# Patient Record
Sex: Female | Born: 1990 | Race: White | Hispanic: Yes | Smoking: Never smoker
Health system: Southern US, Community
[De-identification: ages and names within clinical notes are randomized; demographics above are authoritative.]

## PROBLEM LIST (undated history)

## (undated) DIAGNOSIS — R569 Unspecified convulsions: Secondary | ICD-10-CM

## (undated) DIAGNOSIS — D332 Benign neoplasm of brain, unspecified: Secondary | ICD-10-CM

## (undated) HISTORY — DX: Unspecified convulsions: R56.9

## (undated) HISTORY — PX: BRAIN SURGERY: SHX531

## (undated) HISTORY — DX: Benign neoplasm of brain, unspecified: D33.2

---
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
## 2011-03-22 NOTE — Discharge Summary (Signed)
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   SIGNIFICANT FINDINGS:  Dilantin level is 46.1.  Head CT showed stable  1.5 cm x 3 cm left frontal paramedian brain lesion.  Patient was taking  Dilantin Extended Release 400 mg p.o. b.i.d. and it is unclear how she  was converted from her normal Tegretol 400 mg p.o. b.i.d. to this as  there is no comment in the neurology notes that we obtained from [facility]  about the change.  CMP was normal.  Urine pregnancy test was negative.  White blood cell count 5.9, hemoglobin 11.7, platelets 263.  Her  Dilantin level, at discharged, was 34.9.   TREATMENT:  IV fluids and her Dilantin was held and the lab trend was  followed.  Patient was asymptomatic at discharge and at her baseline  without any seizure activity.   OPERATIONS/PROCEDURES:  None.   FINAL DIAGNOSES:  1. Dilantin toxicity resolved, symptoms with continued elevated blood      levels.  2. 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   DISCHARGE MEDICATIONS/INSTRUCTIONS:  Tegretol 400 mg p.o. b.i.d.  Patient is to have a Dilantin level checked on May 16 at [facility]  and a Dilantin and Tegretol level check in May 19 by [facility].  Both results are to be called to the pediatric resident on-call.   PENDING ISSUES AND RESULTS TO BE FOLLOWED:  None.   FOLLOWUP:  Patient is to follow up with the [facility] in  4-6  weeks once she obtains her Medicaid card.  She is also to follow up with  Neurosurgery at [facility] for tumor removal in the next week and a half.   DISCHARGE WEIGHT:  60 kilograms.   DISCHARGE CONDITION:  Improved and stable.          ______________________________  [name], MD    SN/MEDQ  D:  03/22/2007  T:  03/22/2007  Job:  [id number]   cc:   Kennedy Bucker, M.D.

## 2011-03-25 NOTE — Discharge Summary (Signed)
Amanda Hart, BALIK             ACCOUNT NO.:  [id number]   MEDICAL RECORD NO.:  [id number]          PATIENT TYPE:  INP   LOCATION:  6150                         FACILITY:  [facility]   PHYSICIAN:  [name], MD    DATE OF BIRTH:  1991-08-05   DATE OF ADMISSION:  01/08/2007  DATE OF DISCHARGE:  01/09/2007                               DISCHARGE SUMMARY   REASON FOR HOSPITALIZATION:  Was seizures.   HOSPITAL COURSE:  This is a 20 year old female with a history of  generalized tonic clonic seizures, previously occurring 1 time per year.  Diagnosis occurred several years ago.  In the last several weeks, her  seizure frequency has increased to 2 times per week and then yesterday,  she had at least 6 episodes of seizure activity at home.  This activity  was characterized by right eye deviation initially, which progressed to  generalized tonic clonic activity lasting several minutes.  She was  evaluated in the emergency department where a CT showed a low  attenuation collection in the left parasagittal area.  She was febrile  upon presentation and so underwent lumbar puncture to exclude the  possibility of meningitis.  This showed 3 white blood cells, 2 red blood  cells, protein of 28, glucose of 62 with a culture pending.  Given her  CT findings, she had MRI/MRA performed on January 09, 2007, which showed a  cystic lesion high in the left parietal area.  There was no effect or  edema.  In discussion with pediatric neurology, there was concern that  this lesion may represent a glioma versus ganglio glioma versus DNET,  which prompted discussion with neurosurgery at [facility] and led  to the patient's transfer.  Additional labs here included normal  chemistries, normal CBC, normal urinalysis and negative rapid strep.   FINAL DIAGNOSES:  1. Left parietal cystic lesion.  2. Seizure disorder.   DISCHARGE MEDICATIONS:  1. Tegretol 200 mg by mouth b.i.d.  2. Ativan 2 mg IV q.2 hours  p.r.n. seizure activity.   DISPOSITION:  Patient was transferred to [facility] to the  Neurosurgery service, under attending Dr. Hedwig Morton.   PENDING RESULTS:  At the time of discharge, include CSF culture, blood  culture, urine culture and neutrocytosis studies from the CSF.   The entire chart was copied for transfer to [facility] and the  patient was transported via [facility].           ______________________________  [name], MD     SN/MEDQ  D:  01/09/2007  T:  01/10/2007  Job:  [id number]

## 2011-03-25 NOTE — Procedures (Signed)
EEG NUMBER:  [id number]   HISTORY:  This is a 20 year old Hispanic female who has possible seizure  activity, had several episodes once a year for the past 3 years.   PROCEDURE:  This is a routine EEG, technical description.  Throughout  this routine EEG, there is a posterior dominant rhythm of 9-10 Hz  activity at 10-25 microvolts.  Background activity is symmetric, mostly  comprised of alpha range activity at 10-20 microvolts.  There is one  single questionable, isolated right central sharp transient noticed.  This seems to come out of the background, and the significance is  uncertain.  It does not seem to be epileptogenic in character.  With  photic stimulation, there is symmetric photic driving response.  Hyperventilation does not produce any significant abnormalities.  The  patient does become drowsy; however, does not enter stage II sleep.  Throughout this record, there is no definitive epileptiform activity  noticed.   IMPRESSION:  This routine EEG is essentially within normal limits in the  awake state.  There is a questionable isolated right, sharp transient;  however, this seems to come out of the background.  It does not seem  epileptogenic.  I would recommend getting a sleep-deprived EEG to  further delineate, if seizures are of high concern.  Clinical  correlation is advised.      Bevelyn Buckles. Nash Shearer, M.D.  Electronically Signed     [id number]  D:  10/19/2006 14:26:21  T:  10/19/2006 22:02:58  Job #:  [id number]

## 2011-03-25 NOTE — Consult Note (Signed)
NAMESOILA, PRINTUP             ACCOUNT NO.:  [id number]   MEDICAL RECORD NO.:  [id number]          PATIENT TYPE:  INP   LOCATION:  6150                         FACILITY:  [facility]   PHYSICIAN:  Bevelyn Buckles. Champey, M.D.DATE OF BIRTH:  10-13-1991   DATE OF CONSULTATION:  DATE OF DISCHARGE:                                 CONSULTATION   NEUROLOGY CONSULTATION:   REASON FOR CONSULTATION:  Seizures.   REQUESTING PHYSICIAN:  Dr. Rubin Payor   HISTORY OF PRESENT ILLNESS:  Amanda Hart is a 20 year old Hispanic  female with past medical history of seizures (not on any antiepileptic  drugs), who is followed by Dr. Sharene Skeans who presents with increased  frequency of seizures, a total of six to seven today.  The patient has  had seizures for the past three years, initially the seizures were  occurring once a year.  The patient was seen in the office in December  of 2007 at which time had a CT, results are unknown and an EEG, EEG was  essentially normal with a questionable right sharp transient, no repeat  study was performed.  Over the past month, patient was having increased  frequency of seizures approximately two per week.  Since yesterday, the  patient has had six to seven seizures, EMS was called, the patient was  given Valium en route and was given Ativan in the ED and the patient was  noticed to be febrile as well.  Patient has had no ill contacts.  Patient's mother states the seizures usually start with headache and  nausea, patient has right eye deviation, extremities become stiff and  rigid with generalize shaking seizure that usually lasts anywhere from  two to three minutes in duration and afterwards the patient is very  confused and tired.  Patient has no history of head trauma or  infections.  Patient is currently drowsy and sedated.  No other recent  complaints of focal weakness, numbness, dizziness, vertigo, swallowing  problems, chewing problems or falls.   PAST MEDICAL  HISTORY:  Positive for seizures.   CURRENT MEDICATIONS:  None.   ALLERGIES:  No known drug allergies.   FAMILY HISTORY:  Negative for seizures.   SOCIAL HISTORY:  Patient is in the 10th grade; is a good Consulting civil engineer. No  history of developmental delay.   REVIEW OF SYSTEMS:  Unable to assess at this time secondary to  drowsiness, sedation and postictal.   PHYSICAL EXAMINATION:  VITAL SIGNS:  T-max is 100.8; T-current is 99.2.  Blood pressure is 109/41.  Pulse is 90.  Respirations 22.  O2 sat is  98%.  HEENT:  Normocephalic, atraumatic.  Positive doll's eyes.  Pupils are  equal and sluggish, but reactive.  NECK:  Supple, no carotid bruits.  HEART:  Regular.  LUNGS:  Clear.  ABDOMEN:  Soft, nontender.  EXTREMITIES:  Show good pulses.  NEUROLOGICAL EXAMINATION:  Patient is drowsy and lethargic/sedated.  She  is arousable and answers a few questions appropriately such as her name,  place, and age.  Cranial nerves:  Patient has positive doll's eyes.  Pupils are equal, round and reactive to  light.  Face is symmetric.  Motor examination:  Patient moving all four extremities equally upon  stimulation, has decreased tone in all four extremities.  Sensory  examination:  Patient withdraws in all four extremities to painful  stimuli.  Reflexes are 1 to 2+ throughout.  Toes are neutral  bilaterally.  Cerebellar function and gait are not able to be assessed  at this time secondary to patient's mental status.   CT of the head showed a left parasagittal hypodensity, questionable  dermoid versus epidermoid.  Recommend and MRI scan for further  evaluation.   LABS:  WBC is 8.4, hemoglobin 11.9, hematocrit is 35.0, platelets 240,  sodium is 141, potassium is 3.5, chloride is 111, CO2 is 20, BUN 4,  creatinine 0.8, glucose 88.  CSF shows 3 WBCs, 2 RBCs, protein 28,  glucose 62.   IMPRESSION:  This is a 20 year old Hispanic female with recurrent  seizures, fever.  Description of patient's seizures  seem possibly  partial onset with secondary generalization, especially with the  patient's right eye deviation, with the patient's left parasagittal  hypodensity on the CT scan, which could be her focus.  Patient was seen  in the emergency room and given Ativan.  Also start Tegretol 200 mg  twice a day when patient is taking p.o. medications.  She is currently  sedated and drowsy.  We will check an MRI to further evaluate  hypodensity in left parasagittal area, and we will check an EEG in the  morning.  Continue the Ativan p.r.n. if the patient has recurrent  seizures.  LP results are noted.  HSV-PCR is pending.  We will  empirically start acyclovir until PCR results return.  I will discuss  the case and follow up with Dr. Sharene Skeans.  I have discussed the case  with primary pediatric service.  Please call with any further questions  or concerns.   We will follow the patient.      Bevelyn Buckles. Nash Shearer, M.D.  Electronically Signed     DRC/MEDQ  D:  01/08/2007  T:  01/09/2007  Job:  [id number]

## 2011-03-25 NOTE — Procedures (Signed)
EEG NUMBER:  [id number].   CLINICAL HISTORY:  The patient is a 20 year old female with a history of  partial seizures, head and eyes deviated to the right, with  unresponsiveness and secondary generalization.  Study is being done to  look for the presence of seizure disorder.  345.10.   PROCEDURE:  The tracing is carried out on a 32-channel digital Cadwell  recorder reformatted into 16 channel montages, with 1 devoted to EKG.  The patient was awake and asleep during the recording.  The  International 10/20 system lead placement was used.   MEDICATIONS:  Tegretol, ceftriaxone, dextrose acyclovir, acetaminophen,  and lorazepam.   DESCRIPTION OF FINDINGS:  Dominant frequency is a 9 Hz 30 microvolt  activity that is most common the posterior temporal regions but is seen  in a lower voltage similar frequency occipitally.   Essentially predominant mixed frequency theta range activity of 30  microvolts was seen.   The patient has bursts of generalized rhythmic 70 microvolt delta range  activity that is a manifestation of drowsiness.   There rare sharply contoured slow waves in the temporal regions.  These  are not consistent.   The patient drifts into natural sleep with vertex sharp waves and second  symmetric and synchronous sleep spindles and low voltage delta range  activity.  Toward the end of the record, generalized delta range  activity was seen in bursts as before.  Photic stimulation failed to  induce a driving response.  Hyperventilation was not carried out.   EKG showed a regular sinus rhythm with ventricular response of 102 beats  per minute.   IMPRESSION:  Abnormal EEG on the basis of mild diffuse background  slowing.  This is a nonspecific indicator of neuronal dysfunction that  in all likelihood relates to a postictal state.  There is no focality in  the record in no evidence of seizures.      Deanna Artis. Sharene Skeans, M.D.  Electronically Signed     ZOX:WRUE  D:   01/09/2007 17:17:42  T:  01/10/2007 07:51:50  Job #:  [id number]   cc:   [name], MD   [facility] [address]  [address]  [facility]  [place] [address]

## 2011-08-25 LAB — CBC
HCT: 36.1
Hemoglobin: 12.1
RBC: 4.09
RDW: 12.8

## 2011-08-25 LAB — DIFFERENTIAL
Eosinophils Relative: 1
Lymphocytes Relative: 22 — ABNORMAL LOW
Monocytes Absolute: 0.4
Monocytes Relative: 7
Neutro Abs: 4.1

## 2017-05-11 DIAGNOSIS — R569 Unspecified convulsions: Secondary | ICD-10-CM | POA: Diagnosis not present

## 2017-05-30 DIAGNOSIS — R569 Unspecified convulsions: Secondary | ICD-10-CM | POA: Diagnosis not present

## 2017-05-30 DIAGNOSIS — R8279 Other abnormal findings on microbiological examination of urine: Secondary | ICD-10-CM | POA: Diagnosis not present

## 2017-07-28 DIAGNOSIS — Z85841 Personal history of malignant neoplasm of brain: Secondary | ICD-10-CM | POA: Diagnosis not present

## 2017-07-28 DIAGNOSIS — R569 Unspecified convulsions: Secondary | ICD-10-CM | POA: Diagnosis not present

## 2017-08-05 DIAGNOSIS — E860 Dehydration: Secondary | ICD-10-CM | POA: Diagnosis not present

## 2017-10-20 DIAGNOSIS — N63 Unspecified lump in unspecified breast: Secondary | ICD-10-CM | POA: Diagnosis not present

## 2017-10-20 DIAGNOSIS — R569 Unspecified convulsions: Secondary | ICD-10-CM | POA: Diagnosis not present

## 2017-12-07 DIAGNOSIS — Z01419 Encounter for gynecological examination (general) (routine) without abnormal findings: Secondary | ICD-10-CM | POA: Diagnosis not present

## 2017-12-07 DIAGNOSIS — R590 Localized enlarged lymph nodes: Secondary | ICD-10-CM | POA: Diagnosis not present

## 2017-12-19 ENCOUNTER — Ambulatory Visit: Payer: Commercial Managed Care - PPO | Admitting: Neurology

## 2017-12-19 ENCOUNTER — Encounter (INDEPENDENT_AMBULATORY_CARE_PROVIDER_SITE_OTHER): Payer: Self-pay

## 2017-12-19 ENCOUNTER — Encounter: Payer: Self-pay | Admitting: Neurology

## 2017-12-19 VITALS — BP 128/80 | HR 71 | Ht 62.0 in | Wt 140.0 lb

## 2017-12-19 DIAGNOSIS — D330 Benign neoplasm of brain, supratentorial: Secondary | ICD-10-CM | POA: Diagnosis not present

## 2017-12-19 DIAGNOSIS — G40209 Localization-related (focal) (partial) symptomatic epilepsy and epileptic syndromes with complex partial seizures, not intractable, without status epilepticus: Secondary | ICD-10-CM | POA: Diagnosis not present

## 2017-12-19 MED ORDER — LAMOTRIGINE 25 MG PO TABS: 25.0000 mg | ORAL_TABLET | Freq: Every day | ORAL | 0 refills | Status: DC

## 2017-12-19 MED ORDER — LAMOTRIGINE 100 MG PO TABS: 100.0000 mg | ORAL_TABLET | Freq: Every day | ORAL | 11 refills | Status: DC

## 2017-12-19 NOTE — Addendum Note (Signed)
Addended by: Inis Sizer D on: 12/19/2017 08:49 AM   Modules accepted: Orders

## 2017-12-19 NOTE — Progress Notes (Signed)
PATIENT: Amanda Hart DOB: 1991-09-01  Chief Complaint  Patient presents with  . Seizures    She was diagnosed with seizures in her teens. She was discovered to have a benign brain tumor that was removed.  After her surgery, she took medication for one year and then it was discontinued.  She did not have another seizure for around ten years.  She is currenlty taking carbamazepine 200mg , two tablets BID.  Her last seizure was 2+ years ago.  She is here to establish new neurology care.  Marland Kitchen PCP    Lujean Amel, MD     HISTORICAL  Amanda Hart is a 27 year old female, seen in refer by primary care physician Dr. Dorthy Cooler, [place] for evaluation of seizure, initial evaluation was on December 19, 2017.  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  Laboratory evaluations normal CMP, creatinine of 0.63, CBC hemoglobin of 12.9 Tegretol level was  1.9  REVIEW OF SYSTEMS: Full 14 system review of systems performed and notable only for as above  ALLERGIES: No Known Allergies  HOME MEDICATIONS: Current Outpatient Medications  Medication Sig Dispense Refill  . carbamazepine (TEGRETOL) 200 MG tablet Take 400 mg by mouth 2 (two) times daily.  4   No current facility-administered medications for this visit.     PAST MEDICAL HISTORY: Past Medical History:  Diagnosis Date  . Benign brain tumor ([place])   . Seizure ([place])     PAST SURGICAL HISTORY: Past Surgical History:  Procedure Laterality Date  . BRAIN SURGERY     removal of benign tumor    FAMILY HISTORY: Family History  Problem Relation Age of Onset  . Healthy Mother   . Healthy Father     SOCIAL HISTORY:  Social History   Socioeconomic History  . Marital status: Married    Spouse name: Not on file  . Number of children: 0  . Years of education: 49  . Highest education level: Associate degree: occupational, Hotel manager, or vocational program  Social Needs  . Financial resource strain: Not on file  . Food insecurity - worry: Not on file  . Food insecurity - inability: Not on file  . Transportation needs - medical: Not on file  . Transportation needs - non-medical: Not on file  Occupational History  . Occupation: Retail  Tobacco Use  . Smoking status: Never Smoker  . Smokeless tobacco: Never Used  Substance and Sexual Activity  . Alcohol use: No  Frequency: Never  . Drug use: No  . Sexual activity: Not on file  Other Topics Concern  . Not on file  Social History Narrative   Lives at home with her husband.   Right-handed.   Occasional caffeine use.     PHYSICAL EXAM   Vitals:   12/19/17 0752  BP: 128/80  Pulse: 71  Weight: 140 lb (63.5 kg)  Height: 5\' 2"  (1.575 m)    Not recorded      Body mass index is 25.61 kg/m.  PHYSICAL EXAMNIATION:  Gen: NAD, conversant, well nourised, obese, well groomed                     Cardiovascular:  Regular rate rhythm, no peripheral edema, warm, nontender. Eyes: Conjunctivae clear without exudates or hemorrhage Neck: Supple, no carotid bruits. Pulmonary: Clear to auscultation bilaterally   NEUROLOGICAL EXAM:  MENTAL STATUS: Speech:    Speech is normal; fluent and spontaneous with normal comprehension.  Cognition:     Orientation to time, place and person     Normal recent and remote memory     Normal Attention span and concentration     Normal Language, naming, repeating,spontaneous speech     Fund of knowledge   CRANIAL NERVES: CN II: Visual fields are full to confrontation. Fundoscopic exam is normal with sharp discs and no vascular changes. Pupils are round equal and briskly reactive to light. CN III, IV, VI: extraocular movement are normal. No ptosis. CN V: Facial sensation is intact to pinprick in all 3 divisions bilaterally. Corneal responses are intact.  CN VII: Face is symmetric with normal eye closure and smile. CN VIII: Hearing is normal to rubbing fingers CN IX, X: Palate elevates symmetrically. Phonation is normal. CN XI: Head turning and shoulder shrug are intact CN XII: Tongue is midline with normal movements and no atrophy.  MOTOR: There is no pronator drift of out-stretched arms. Muscle bulk and tone are normal. Muscle strength is normal.  REFLEXES: Reflexes are 2+ and symmetric at the biceps, triceps, knees, and ankles. Plantar responses are flexor.  SENSORY: Intact to light touch, pinprick, positional sensation and vibratory sensation are intact in fingers and toes.  COORDINATION: Rapid alternating movements and fine finger movements are intact. There is no dysmetria on finger-to-nose and heel-knee-shin.    GAIT/STANCE: Posture is normal. Gait is steady with normal steps, base, arm swing, and turning. Heel and toe walking are normal. Tandem gait is normal.  Romberg is absent.   DIAGNOSTIC DATA (LABS, IMAGING, TESTING) - I reviewed patient records,  labs, notes, testing and imaging myself where available.   ASSESSMENT AND PLAN  Amanda Hart is a 27 y.o. 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 Complex partial seizure  She still has multiple recurrent spells while taking Tegretol 400 mg twice a day, the level was within therapeutic level,  Discussed with patient, will switch her to lamotrigine 100 mg / 200 mg, tapering off Tegretol, schedule was written out for her  Repeat MRI of the brain with and without contrast  EEG   Marcial Pacas, M.D. Ph.D.  [facility] [address] [place], [place] [address] Ph: [phone] Fax: [phone]  YP:[id number], [place], MD

## 2017-12-20 ENCOUNTER — Telehealth: Payer: Self-pay | Admitting: Neurology

## 2017-12-20 LAB — COMPREHENSIVE METABOLIC PANEL
A/G RATIO: 2 (ref 1.2–2.2)
ALT: 9 IU/L (ref 0–32)
AST: 15 IU/L (ref 0–40)
Albumin: 4.5 g/dL (ref 3.5–5.5)
Alkaline Phosphatase: 92 IU/L (ref 39–117)
BUN/Creatinine Ratio: 14 (ref 9–23)
BUN: 9 mg/dL (ref 6–20)
Bilirubin Total: 0.2 mg/dL (ref 0.0–1.2)
CALCIUM: 8.9 mg/dL (ref 8.7–10.2)
CO2: 22 mmol/L (ref 20–29)
CREATININE: 0.64 mg/dL (ref 0.57–1.00)
Chloride: 103 mmol/L (ref 96–106)
GFR, EST AFRICAN AMERICAN: 141 mL/min/{1.73_m2} (ref >59)
GFR, EST NON AFRICAN AMERICAN: 123 mL/min/{1.73_m2} (ref >59)
GLOBULIN, TOTAL: 2.3 g/dL (ref 1.5–4.5)
Glucose: 92 mg/dL (ref 65–99)
POTASSIUM: 4.2 mmol/L (ref 3.5–5.2)
Sodium: 140 mmol/L (ref 134–144)
TOTAL PROTEIN: 6.8 g/dL (ref 6.0–8.5)

## 2017-12-20 LAB — CBC WITH DIFFERENTIAL
BASOS: 1 %
Basophils Absolute: 0 10*3/uL (ref 0.0–0.2)
EOS (ABSOLUTE): 0 10*3/uL (ref 0.0–0.4)
EOS: 1 %
HEMATOCRIT: 36.6 % (ref 34.0–46.6)
Hemoglobin: 12.7 g/dL (ref 11.1–15.9)
IMMATURE GRANS (ABS): 0 10*3/uL (ref 0.0–0.1)
IMMATURE GRANULOCYTES: 0 %
LYMPHS: 32 %
Lymphocytes Absolute: 1.1 10*3/uL (ref 0.7–3.1)
MCH: 30.7 pg (ref 26.6–33.0)
MCHC: 34.7 g/dL (ref 31.5–35.7)
MCV: 88 fL (ref 79–97)
Monocytes Absolute: 0.3 10*3/uL (ref 0.1–0.9)
Monocytes: 8 %
NEUTROS PCT: 58 %
Neutrophils Absolute: 2.1 10*3/uL (ref 1.4–7.0)
RBC: 4.14 x10E6/uL (ref 3.77–5.28)
RDW: 13.2 % (ref 12.3–15.4)
WBC: 3.5 10*3/uL (ref 3.4–10.8)

## 2017-12-20 LAB — TSH: TSH: 1.09 u[IU]/mL (ref 0.450–4.500)

## 2017-12-20 NOTE — Telephone Encounter (Signed)
-----   Message from Marcial Pacas, MD sent at 12/20/2017 11:16 AM EST ----- Please call patient for normal laboratory result

## 2017-12-20 NOTE — Telephone Encounter (Signed)
Pt returned call and I was able to inform her of her lab work all being WNL. Pt verbalized understanding. Pt had no questions at this time but was encouraged to call back if questions arise.

## 2017-12-20 NOTE — Telephone Encounter (Signed)
Called the patient to discuss the lab work.no answer. LVM for the patient to call back

## 2017-12-21 ENCOUNTER — Other Ambulatory Visit: Payer: Commercial Managed Care - PPO

## 2018-01-01 ENCOUNTER — Other Ambulatory Visit: Payer: Commercial Managed Care - PPO

## 2018-01-02 ENCOUNTER — Encounter: Payer: Self-pay | Admitting: Neurology

## 2018-03-20 ENCOUNTER — Ambulatory Visit: Payer: Commercial Managed Care - PPO | Admitting: Neurology

## 2018-05-09 DIAGNOSIS — H10021 Other mucopurulent conjunctivitis, right eye: Secondary | ICD-10-CM | POA: Diagnosis not present

## 2018-05-23 ENCOUNTER — Telehealth: Payer: Self-pay | Admitting: Neurology

## 2018-05-23 MED ORDER — CARBAMAZEPINE 200 MG PO TABS: 400.0000 mg | ORAL_TABLET | Freq: Two times a day (BID) | ORAL | 1 refills | Status: DC

## 2018-05-23 NOTE — Telephone Encounter (Signed)
Dr. Rhea Belton notes from 12/19/17:  Complex partial seizure             She still has multiple recurrent spells while taking Tegretol 400 mg twice a day, the level was within therapeutic level,             Discussed with patient, will switch her to lamotrigine 100 mg / 200 mg, tapering off Tegretol, schedule was written out for her             Repeat MRI of the brain with and without contrast             EEG

## 2018-05-23 NOTE — Telephone Encounter (Signed)
Pt called stating she has decided to to stay on carbamazepine (TEGRETOL) 200 MG tablet instead of tapering off. Pt is now down to only a few tablets left, requesting a refill sent to [facility]

## 2018-05-23 NOTE — Telephone Encounter (Addendum)
Returned call to patient today.  States she never started the lamotrigine and decided to continue carbamazepine 400mg , BID.  She had planned on discussing this with Dr. Krista Blue at her next visit on 07/03/18.  She will run out of carbamazepine 200mg , two tablets BID prior to her appt.  Per vo by Dr. Rexene Alberts (work-in MD), provide patient with a refill of carbamazepine, at current dose, to last until at her next appt with Dr. Krista Blue.

## 2018-07-03 ENCOUNTER — Telehealth: Payer: Self-pay | Admitting: Neurology

## 2018-07-03 ENCOUNTER — Encounter: Payer: Self-pay | Admitting: Neurology

## 2018-07-03 ENCOUNTER — Ambulatory Visit: Payer: Commercial Managed Care - PPO | Admitting: Neurology

## 2018-07-03 VITALS — BP 125/83 | HR 82 | Ht 62.0 in | Wt 141.0 lb

## 2018-07-03 DIAGNOSIS — G40209 Localization-related (focal) (partial) symptomatic epilepsy and epileptic syndromes with complex partial seizures, not intractable, without status epilepticus: Secondary | ICD-10-CM

## 2018-07-03 MED ORDER — CARBAMAZEPINE 200 MG PO TABS: 400.0000 mg | ORAL_TABLET | Freq: Two times a day (BID) | ORAL | 4 refills | Status: DC

## 2018-07-03 NOTE — Progress Notes (Signed)
PATIENT: Amanda Hart DOB: 20-Nov-1990  Chief Complaint  Patient presents with  . Seizures    She never started Lamictal because she felt uncomfortable switching medications.  She has continued taking Tegretol 400mg  BID.  She missed her EEG appt and never rescheduled.  She never made the appt for her MRI.      HISTORICAL  Amanda Hart is a 27 year old female, seen in refer by primary care physician Dr. Dorthy Cooler, [place] for evaluation of seizure, initial evaluation was on December 19, 2017.  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  REVIEW OF SYSTEMS: Full 14 system review of systems performed and notable only for as above  ALLERGIES: No Known Allergies  HOME MEDICATIONS: Current Outpatient Medications  Medication Sig Dispense Refill  . carbamazepine (TEGRETOL) 200 MG tablet Take 2 tablets (400 mg total) by mouth 2 (two) times daily. 120 tablet 1   No current facility-administered medications for this visit.     PAST MEDICAL HISTORY: Past Medical History:  Diagnosis Date  . Benign brain tumor ([place])   . Seizure ([place])     PAST SURGICAL HISTORY: Past Surgical History:  Procedure Laterality Date  . BRAIN SURGERY     removal of benign tumor    FAMILY HISTORY: Family History  Problem Relation Age of Onset  . Healthy Mother   . Healthy Father     SOCIAL HISTORY:  Social History   Socioeconomic History  . Marital status: Married    Spouse name: Not on file  . Number of children: 0  . Years of education: 59  . Highest education level: Associate degree: occupational, Hotel manager, or vocational program  Occupational History  . Occupation: Lexicographer  . Financial resource strain: Not on file  . Food insecurity:    Worry: Not on file    Inability: Not on file  . Transportation  needs:    Medical: Not on file    Non-medical: Not on file  Tobacco Use  . Smoking status: Never Smoker  . Smokeless tobacco: Never Used  Substance and Sexual Activity  . Alcohol use: No    Frequency: Never  . Drug use: No  . Sexual activity: Not on file  Lifestyle  . Physical activity:    Days per week: Not on file    Minutes per session: Not on file  . Stress: Not on file  Relationships  . Social connections:    Talks on phone: Not on file     Gets together: Not on file    Attends religious service: Not on file    Active member of club or organization: Not on file    Attends meetings of clubs or organizations: Not on file    Relationship status: Not on file  . Intimate partner violence:    Fear of current or ex partner: Not on file    Emotionally abused: Not on file    Physically abused: Not on file    Forced sexual activity: Not on file  Other Topics Concern  . Not on file  Social History Narrative   Lives at home with her husband.   Right-handed.   Occasional caffeine use.     PHYSICAL EXAM   Vitals:   07/03/18 0716  BP: 125/83  Pulse: 82  Weight: 141 lb (64 kg)  Height: 5\' 2"  (1.575 m)    Not recorded      Body mass index is 25.79 kg/m.  PHYSICAL EXAMNIATION:  Gen: NAD, conversant, well nourised, obese, well groomed                     Cardiovascular: Regular rate rhythm, no peripheral edema, warm, nontender. Eyes: Conjunctivae clear without exudates or hemorrhage Neck: Supple, no carotid bruits. Pulmonary: Clear to auscultation bilaterally   NEUROLOGICAL EXAM:  MENTAL STATUS: Speech:    Speech is normal; fluent and spontaneous with normal comprehension.  Cognition:     Orientation to time, place and person     Normal recent and remote memory     Normal Attention span and concentration     Normal Language, naming, repeating,spontaneous speech     Fund of knowledge   CRANIAL NERVES: CN II: Visual fields are full to confrontation. Fundoscopic exam is normal with sharp discs and no vascular changes. Pupils are round equal and briskly reactive to light. CN III, IV, VI: extraocular movement are normal. No ptosis. CN V: Facial sensation is intact to pinprick in all 3 divisions bilaterally. Corneal responses are intact.  CN VII: Face is symmetric with normal eye closure and smile. CN VIII: Hearing is normal to rubbing fingers CN IX, X: Palate elevates symmetrically. Phonation is normal. CN XI:  Head turning and shoulder shrug are intact CN XII: Tongue is midline with normal movements and no atrophy.  MOTOR: There is no pronator drift of out-stretched arms. Muscle bulk and tone are normal. Muscle strength is normal.  REFLEXES: Reflexes are 2+ and symmetric at the biceps, triceps, knees, and ankles. Plantar responses are flexor.  SENSORY: Intact to light touch, pinprick, positional sensation and vibratory sensation are intact in fingers and toes.  COORDINATION: Rapid alternating movements and fine finger movements are intact. There is no dysmetria on finger-to-nose and heel-knee-shin.    GAIT/STANCE: Posture is normal. Gait is steady with normal steps, base, arm swing, and turning. Heel and toe walking  are normal. Tandem gait is normal.  Romberg is absent.   DIAGNOSTIC DATA (LABS, IMAGING, TESTING) - I reviewed patient records, labs, notes, testing and imaging myself where available.   ASSESSMENT AND PLAN  Amanda Hart is a 27 y.o. 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 Complex partial seizure  She still has multiple recurrent spells while taking Tegretol 400 mg twice a day, the level was within therapeutic level,  I have suggeste no change to lamotrigine, but she is hesitant to make any change at this point,  MRI of the brain with and without contrast  EEG  No driving for 6 months if she has any episode of sudden loss of consciousness   Marcial Pacas, M.D. Ph.D.  [facility] [address] [place], [place] [address] Ph: [phone] Fax: [phone]  PJ:[email or web address], [place], MD

## 2018-07-03 NOTE — Telephone Encounter (Signed)
left message w/pt mom to give me a call back   [place]: [phone] (exp. 07/03/18 to 08/01/18)

## 2018-07-30 NOTE — Telephone Encounter (Signed)
Spoke to the patient I informed her since her deducible hasn't been met it would be about $1,592.91  I did off the payment plan. She stated she wanted to hold off right now.

## 2018-07-30 NOTE — Telephone Encounter (Signed)
Pt has called asking to be scheduled for her MRI 10-29. Please call back

## 2018-09-04 ENCOUNTER — Other Ambulatory Visit: Payer: Commercial Managed Care - PPO

## 2018-09-05 ENCOUNTER — Encounter: Payer: Self-pay | Admitting: Neurology

## 2018-10-29 DIAGNOSIS — Z0000 Encounter for general adult medical examination without abnormal findings: Secondary | ICD-10-CM | POA: Diagnosis not present

## 2018-10-29 DIAGNOSIS — Z13220 Encounter for screening for lipoid disorders: Secondary | ICD-10-CM | POA: Diagnosis not present

## 2019-07-08 ENCOUNTER — Encounter: Payer: Self-pay | Admitting: Adult Health

## 2019-07-08 ENCOUNTER — Ambulatory Visit: Payer: Commercial Managed Care - PPO | Admitting: Adult Health

## 2019-07-12 ENCOUNTER — Other Ambulatory Visit: Payer: Self-pay | Admitting: Neurology

## 2019-09-08 ENCOUNTER — Other Ambulatory Visit: Payer: Self-pay | Admitting: Neurology

## 2019-09-11 NOTE — Progress Notes (Signed)
PATIENT: Amanda Hart DOB: [birth year]  REASON FOR VISIT: follow up HISTORY FROM: patient  HISTORY OF PRESENT ILLNESS: Today 09/12/19  HISTORY Amanda Hart is a 28 year old female, seen in refer by primary care physician Dr. Dorthy Cooler, [place] for evaluation of seizure, initial evaluation was on December 19, 2017.  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  Update September 12, 2019 SS: She has not had recurrent seizure since last seen, last seizure was in 2015.  She remains on Tegretol 400 mg twice a day.  She is tolerating medication without side effect.  She has not been interested in switching to Lamictal in the past.  She denies any further dj vu episodes, since changing her diet to limiting sugar and carbs.  She is not driving a car right now.  She does not work, says she is a stay-at-home wife.  She indicates her overall health is well.  She denies any new problems or concerns. She was unable to get EEG or MRI of the brain due to high cost.   REVIEW OF SYSTEMS: Out of a complete 14 system review of symptoms, the patient complains only of the following symptoms, and all other reviewed systems are negative.  Seizures   ALLERGIES: No Known Allergies  HOME MEDICATIONS: Outpatient Medications Prior to Visit  Medication Sig Dispense Refill  . carbamazepine (TEGRETOL) 200 MG tablet TAKE 2 TABLETS BY MOUTH 2 (TWO) TIMES DAILY. PLEASE CALL [phone] TO SCHEDULE YEARLY APPT, 120 tablet 0   No facility-administered medications prior to visit.     PAST MEDICAL HISTORY: Past Medical History:  Diagnosis Date  . Benign brain tumor ([place])   . Seizure ([place])     PAST SURGICAL HISTORY: Past Surgical History:  Procedure Laterality Date  . BRAIN SURGERY     removal of benign tumor    FAMILY HISTORY: Family History  Problem Relation  Age of Onset  . Healthy Mother   . Healthy Father   . Healthy Brother     SOCIAL HISTORY: Social History   Socioeconomic History  . Marital status: Married    Spouse name: Not on file  . Number of children: 0  . Years of education: 58  . Highest education level: Associate degree: occupational, Hotel manager, or vocational program  Occupational History  . Occupation: Lexicographer  . Financial resource strain:  Not on file  . Food insecurity    Worry: Not on file    Inability: Not on file  . Transportation needs    Medical: Not on file    Non-medical: Not on file  Tobacco Use  . Smoking status: Never Smoker  . Smokeless tobacco: Never Used  Substance and Sexual Activity  . Alcohol use: No    Frequency: Never  . Drug use: No  . Sexual activity: Not on file  Lifestyle  . Physical activity    Days per week: Not on file    Minutes per session: Not on file  . Stress: Not on file  Relationships  . Social Herbalist on phone: Not on file    Gets together: Not on file    Attends religious service: Not on file    Active member of club or organization: Not on file    Attends meetings of clubs or organizations: Not on file    Relationship status: Not on file  . Intimate partner violence    Fear of current or ex partner: Not on file    Emotionally abused: Not on file    Physically abused: Not on file    Forced sexual activity: Not on file  Other Topics Concern  . Not on file  Social History Narrative   Lives at home with her husband.   Right-handed.   Occasional caffeine use.    PHYSICAL EXAM  Vitals:   09/12/19 1536  BP: 114/62  Pulse: 64  Temp: (!) 97.2 F (36.2 C)  Weight: 113 lb (51.3 kg)  Height: 5\' 4"  (1.626 m)   Body mass index is 19.4 kg/m.  Generalized: Well developed, in no acute distress   Neurological examination  Mentation: Alert oriented to time, place, history taking. Follows all commands speech and language fluent Cranial nerve II-XII: Pupils were equal round reactive to light. Extraocular movements were full, visual field were full on confrontational test. Facial sensation and strength were normal.  Head turning and shoulder shrug  were normal and symmetric. Motor: The motor testing reveals 5 over 5 strength of all 4 extremities. Good symmetric motor tone is noted throughout.  Sensory: Sensory testing is intact to soft touch on all 4 extremities.  No evidence of extinction is noted.  Coordination: Cerebellar testing reveals good finger-nose-finger and heel-to-shin bilaterally.  Gait and station: Gait is normal. Tandem gait is normal. Romberg is negative. No drift is seen.  Reflexes: Deep tendon reflexes are symmetric and normal bilaterally.   DIAGNOSTIC DATA (LABS, IMAGING, TESTING) - I reviewed patient records, labs, notes, testing and imaging myself where available.  Lab Results  Component Value Date   WBC 3.5 12/19/2017   HGB 12.7 12/19/2017   HCT 36.6 12/19/2017   MCV 88 12/19/2017   PLT 271 04/12/2007      Component Value Date/Time   NA 140 12/19/2017 0850   K 4.2 12/19/2017 0850   CL 103 12/19/2017 0850   CO2 22 12/19/2017 0850  GLUCOSE 92 12/19/2017 0850   BUN 9 12/19/2017 0850   CREATININE 0.64 12/19/2017 0850   CALCIUM 8.9 12/19/2017 0850   PROT 6.8 12/19/2017 0850   ALBUMIN 4.5 12/19/2017 0850   AST 15 12/19/2017 0850   ALT 9 12/19/2017 0850   ALKPHOS 92 12/19/2017 0850   BILITOT 0.2 12/19/2017 0850   GFRNONAA 123 12/19/2017 0850   GFRAA 141 12/19/2017 0850   No results found for: CHOL, HDL, LDLCALC, LDLDIRECT, TRIG, CHOLHDL No results found for: HGBA1C No results found for: VITAMINB12 Lab Results  Component Value Date   TSH 1.090 12/19/2017     ASSESSMENT AND PLAN 28 y.o. year old female  has a past medical history of Benign brain tumor ([place]) and Seizure ([place]). here with:  1.  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  Complex partial seizure -She has not had recurrent seizure since 2015, has not had any further dj vu spells since changing her diet -Continue Tegretol 400 mg twice a day, has been resistant to switching to Lamictal previously -She is unable to afford MRI of the brain or EEG at this time -I will check routine lab work today -I will provide a refill for 6 months, at that time, we can check in to see if getting an  MRI of the brain or EEG is possible, she does desire the studies, but cannot afford at this time -She will follow-up in 1 year or sooner if needed.  I spent 15 minutes with the patient. 50% of this time was spent discussing her plan of care.  Butler Denmark, AGNP-C, DNP 09/12/2019, 3:41 PM [facility] [address] [place], [place] [address] [phone]

## 2019-09-12 ENCOUNTER — Ambulatory Visit: Payer: Commercial Managed Care - PPO | Admitting: Neurology

## 2019-09-12 ENCOUNTER — Other Ambulatory Visit: Payer: Self-pay

## 2019-09-12 ENCOUNTER — Encounter: Payer: Self-pay | Admitting: Neurology

## 2019-09-12 VITALS — BP 114/62 | HR 64 | Temp 97.2°F | Ht 64.0 in | Wt 113.0 lb

## 2019-09-12 DIAGNOSIS — G40209 Localization-related (focal) (partial) symptomatic epilepsy and epileptic syndromes with complex partial seizures, not intractable, without status epilepticus: Secondary | ICD-10-CM | POA: Diagnosis not present

## 2019-09-12 MED ORDER — CARBAMAZEPINE 200 MG PO TABS
ORAL_TABLET | ORAL | 6 refills | Status: DC
Start: 2019-09-12 — End: 2020-05-14

## 2019-09-12 NOTE — Patient Instructions (Signed)
Please continue current medications. I will check lab work today. Please consider EEG and MRI.

## 2019-09-13 LAB — COMPREHENSIVE METABOLIC PANEL
ALT: 8 IU/L (ref 0–32)
AST: 10 IU/L (ref 0–40)
Albumin/Globulin Ratio: 1.9 (ref 1.2–2.2)
Albumin: 4.6 g/dL (ref 3.9–5.0)
Alkaline Phosphatase: 76 IU/L (ref 39–117)
BUN/Creatinine Ratio: 10 (ref 9–23)
BUN: 8 mg/dL (ref 6–20)
Bilirubin Total: <0.2 mg/dL (ref 0.0–1.2)
CO2: 23 mmol/L (ref 20–29)
Calcium: 9.2 mg/dL (ref 8.7–10.2)
Chloride: 105 mmol/L (ref 96–106)
Creatinine, Ser: 0.82 mg/dL (ref 0.57–1.00)
GFR calc Af Amer: 112 mL/min/{1.73_m2} (ref >59)
GFR calc non Af Amer: 97 mL/min/{1.73_m2} (ref >59)
Globulin, Total: 2.4 g/dL (ref 1.5–4.5)
Glucose: 89 mg/dL (ref 65–99)
Potassium: 4 mmol/L (ref 3.5–5.2)
Sodium: 142 mmol/L (ref 134–144)
Total Protein: 7 g/dL (ref 6.0–8.5)

## 2019-09-13 LAB — CBC WITH DIFFERENTIAL/PLATELET
Basophils Absolute: 0 10*3/uL (ref 0.0–0.2)
Basos: 1 %
EOS (ABSOLUTE): 0 10*3/uL (ref 0.0–0.4)
Eos: 1 %
Hematocrit: 37.9 % (ref 34.0–46.6)
Hemoglobin: 12.8 g/dL (ref 11.1–15.9)
Immature Grans (Abs): 0 10*3/uL (ref 0.0–0.1)
Immature Granulocytes: 0 %
Lymphocytes Absolute: 1.2 10*3/uL (ref 0.7–3.1)
Lymphs: 29 %
MCH: 30.6 pg (ref 26.6–33.0)
MCHC: 33.8 g/dL (ref 31.5–35.7)
MCV: 91 fL (ref 79–97)
Monocytes Absolute: 0.3 10*3/uL (ref 0.1–0.9)
Monocytes: 8 %
Neutrophils Absolute: 2.5 10*3/uL (ref 1.4–7.0)
Neutrophils: 61 %
Platelets: 206 10*3/uL (ref 150–450)
RBC: 4.18 x10E6/uL (ref 3.77–5.28)
RDW: 12 % (ref 11.7–15.4)
WBC: 4 10*3/uL (ref 3.4–10.8)

## 2019-09-13 LAB — CARBAMAZEPINE LEVEL, TOTAL: Carbamazepine (Tegretol), S: 9.7 ug/mL (ref 4.0–12.0)

## 2019-09-17 ENCOUNTER — Encounter: Payer: Self-pay | Admitting: *Deleted

## 2019-09-23 NOTE — Progress Notes (Signed)
I have reviewed and agreed above plan. 

## 2020-05-14 ENCOUNTER — Other Ambulatory Visit: Payer: Self-pay | Admitting: Neurology

## 2020-09-15 ENCOUNTER — Encounter: Payer: Self-pay | Admitting: Neurology

## 2020-09-15 ENCOUNTER — Ambulatory Visit: Payer: Commercial Managed Care - PPO | Admitting: Neurology

## 2020-09-15 VITALS — BP 118/72 | HR 75 | Ht 64.0 in | Wt 141.0 lb

## 2020-09-15 DIAGNOSIS — G40209 Localization-related (focal) (partial) symptomatic epilepsy and epileptic syndromes with complex partial seizures, not intractable, without status epilepticus: Secondary | ICD-10-CM

## 2020-09-15 MED ORDER — CARBAMAZEPINE 200 MG PO TABS: ORAL_TABLET | ORAL | 3 refills | Status: DC

## 2020-09-15 NOTE — Patient Instructions (Signed)
Continue current medication Check blood work  Order MRI of the brain  Call for seizures  see you back in 1 year

## 2020-09-15 NOTE — Progress Notes (Signed)
PATIENT: Amanda Hart DOB: 01/07/1991  REASON FOR VISIT: follow up HISTORY FROM: patient  HISTORY OF PRESENT ILLNESS: Today 09/15/20  HISTORY Amanda Hart a 29 year old female, seen in refer by primary care physician Dr. Dorthy Cooler, [place] for evaluation of seizure, initial evaluation was on December 19, 2017.  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  Update September 12, 2019 SS: She has not had recurrent seizure since last seen, last seizure was in 2015.  She remains on Tegretol 400 mg twice a day.  She is tolerating medication without side effect.  She has not been interested in switching to Lamictal in the past.  She denies any further dj vu episodes, since changing her diet to limiting sugar and carbs.  She is not driving a car right now.  She does not work, says she is a stay-at-home wife.  She indicates her overall health is well.  She denies any new problems or concerns. She was unable to get EEG or MRI of the brain due to high cost.   Update September 15, 2020 SS: Here today for follow-up, overall doing well, remains on Tegretol 200 mg, 2 tablets twice a day. Last seizure in 2015, no reported dj vu spells. Tolerating well. Is currently looking for a job, has associates degree in business administration. She is married, she does not want children. Has not had MRI or EEG due to cost.  REVIEW OF SYSTEMS: Out of a complete 14 system review of symptoms, the patient complains only of the following symptoms, and all other reviewed systems are negative.  N/a  ALLERGIES: No Known Allergies  HOME MEDICATIONS: Outpatient Medications Prior to Visit  Medication Sig Dispense Refill  . carbamazepine (TEGRETOL) 200 MG tablet TAKE 2 TABLETS BY MOUTH TWICE A DAY 360 tablet 1   No facility-administered medications prior to visit.    PAST MEDICAL HISTORY: Past  Medical History:  Diagnosis Date  . Benign brain tumor ([place])   . Seizure ([place])     PAST SURGICAL HISTORY: Past Surgical History:  Procedure Laterality Date  . BRAIN SURGERY     removal of benign tumor    FAMILY HISTORY: Family History  Problem Relation Age of Onset  . Healthy Mother   . Healthy Father   . Healthy Brother     SOCIAL HISTORY: Social History   Socioeconomic History    . Marital status: Married    Spouse name: Not on file  . Number of children: 0  . Years of education: 48  . Highest education level: Associate degree: occupational, Hotel manager, or vocational program  Occupational History  . Occupation: Retail  Tobacco Use  . Smoking status: Never Smoker  . Smokeless tobacco: Never Used  Vaping Use  . Vaping Use: Never used  Substance and Sexual Activity  . Alcohol use: No  . Drug use: No  . Sexual activity: Not on file  Other Topics Concern  . Not on file  Social History Narrative   Lives at home with her husband.   Right-handed.   Occasional caffeine use.   Social Determinants of Health   Financial Resource Strain:   . Difficulty of Paying Living Expenses: Not on file  Food Insecurity:   . Worried About Charity fundraiser in the Last Year: Not on file  . Ran Out of Food in the Last Year: Not on file  Transportation Needs:   . Lack of Transportation (Medical): Not on file  . Lack of Transportation (Non-Medical): Not on file  Physical Activity:   . Days of Exercise per Week: Not on file  . Minutes of Exercise per Session: Not on file  Stress:   . Feeling of Stress : Not on file  Social Connections:   . Frequency of Communication with Friends and Family: Not on file  . Frequency of Social Gatherings with Friends and Family: Not on file  . Attends Religious Services: Not on file  . Active Member of Clubs or Organizations: Not on file  . Attends Archivist Meetings: Not on file  . Marital Status: Not on file  Intimate Partner Violence:   . Fear of Current or Ex-Partner: Not on file  . Emotionally Abused: Not on file  . Physically Abused: Not on file  . Sexually Abused: Not on file   PHYSICAL EXAM  Vitals:   09/15/20 1503  BP: 118/72  Pulse: 75  Weight: 141 lb (64 kg)  Height: 5\' 4"  (1.626 m)   Body mass index is 24.2 kg/m.  Generalized: Well developed, in no acute distress   Neurological examination   Mentation: Alert oriented to time, place, history taking. Follows all commands speech and language fluent Cranial nerve II-XII: Pupils were equal round reactive to light. Extraocular movements were full, visual field were full on confrontational test. Facial sensation and strength were normal. Head turning and shoulder shrug  were normal and symmetric. Motor: The motor testing reveals 5 over 5 strength of all 4 extremities. Good symmetric motor tone is noted throughout.  Sensory: Sensory testing is intact to soft touch on all 4 extremities. No evidence of extinction is noted.  Coordination: Cerebellar testing reveals good finger-nose-finger and heel-to-shin bilaterally.  Gait and station: Gait is normal.  Reflexes: Deep tendon reflexes are symmetric and normal bilaterally.   DIAGNOSTIC DATA (LABS, IMAGING, TESTING) - I reviewed patient records, labs, notes, testing and imaging  myself where available.  Lab Results  Component Value Date   WBC 4.0 09/12/2019   HGB 12.8 09/12/2019   HCT 37.9 09/12/2019   MCV 91 09/12/2019   PLT 206 09/12/2019      Component Value Date/Time   NA 142 09/12/2019 1600   K 4.0 09/12/2019 1600   CL 105 09/12/2019 1600   CO2 23 09/12/2019 1600   GLUCOSE 89 09/12/2019 1600   BUN 8 09/12/2019 1600   CREATININE 0.82 09/12/2019 1600   CALCIUM 9.2 09/12/2019 1600   PROT 7.0 09/12/2019 1600   ALBUMIN 4.6 09/12/2019 1600   AST 10 09/12/2019 1600   ALT 8 09/12/2019 1600   ALKPHOS 76 09/12/2019 1600   BILITOT <0.2 09/12/2019 1600   GFRNONAA 97 09/12/2019 1600   GFRAA 112 09/12/2019 1600   No results found for: CHOL, HDL, LDLCALC, LDLDIRECT, TRIG, CHOLHDL No results found for: HGBA1C No results found for: VITAMINB12 Lab Results  Component Value Date   TSH 1.090 12/19/2017   ASSESSMENT AND PLAN 29 y.o. year old female  has a past medical history of Benign brain tumor ([place]) and Seizure ([place]). here with  1.  History of left frontoparietal parasagittal area  dysembryoplastic neuroepithelial tumor resection in 2008 2.  Complex partial seizure  -No recurrent seizures since 2015, no further dj vu spells since changing her diet -Continue Tegretol 400 mg twice a day, has been resistant to switching to Lamictal -Will reorder MRI of the brain with and without contrast, has previously not been able to obtain MRI or EEG due to cost, will do MRI now, can only do 1 thing -Check routine blood work -Call for seizure activity, follow-up 1 year or sooner if needed  I spent 20 minutes of face-to-face and non-face-to-face time with patient.  This included previsit chart review, lab review, study review, order entry, electronic health record documentation, patient education.  Butler Denmark, AGNP-C, DNP 09/15/2020, 3:23 PM [facility] [address] [place], [place] [address] [phone]

## 2020-09-16 ENCOUNTER — Telehealth: Payer: Self-pay | Admitting: Neurology

## 2020-09-16 ENCOUNTER — Telehealth: Payer: Self-pay | Admitting: *Deleted

## 2020-09-16 ENCOUNTER — Other Ambulatory Visit: Payer: Self-pay | Admitting: Neurology

## 2020-09-16 DIAGNOSIS — G40209 Localization-related (focal) (partial) symptomatic epilepsy and epileptic syndromes with complex partial seizures, not intractable, without status epilepticus: Secondary | ICD-10-CM

## 2020-09-16 LAB — COMPREHENSIVE METABOLIC PANEL
ALT: 8 IU/L (ref 0–32)
AST: 12 IU/L (ref 0–40)
Albumin/Globulin Ratio: 1.8 (ref 1.2–2.2)
Albumin: 4.6 g/dL (ref 3.9–5.0)
Alkaline Phosphatase: 89 IU/L (ref 44–121)
BUN/Creatinine Ratio: 13 (ref 9–23)
BUN: 7 mg/dL (ref 6–20)
Bilirubin Total: 0.2 mg/dL (ref 0.0–1.2)
CO2: 25 mmol/L (ref 20–29)
Calcium: 9 mg/dL (ref 8.7–10.2)
Chloride: 101 mmol/L (ref 96–106)
Creatinine, Ser: 0.52 mg/dL — ABNORMAL LOW (ref 0.57–1.00)
GFR calc Af Amer: 148 mL/min/{1.73_m2} (ref >59)
GFR calc non Af Amer: 129 mL/min/{1.73_m2} (ref >59)
Globulin, Total: 2.6 g/dL (ref 1.5–4.5)
Glucose: 87 mg/dL (ref 65–99)
Potassium: 4.2 mmol/L (ref 3.5–5.2)
Sodium: 139 mmol/L (ref 134–144)
Total Protein: 7.2 g/dL (ref 6.0–8.5)

## 2020-09-16 LAB — CBC WITH DIFFERENTIAL/PLATELET
Basophils Absolute: 0 10*3/uL (ref 0.0–0.2)
Basos: 1 %
EOS (ABSOLUTE): 0 10*3/uL (ref 0.0–0.4)
Eos: 0 %
Hematocrit: 38.3 % (ref 34.0–46.6)
Hemoglobin: 13 g/dL (ref 11.1–15.9)
Immature Grans (Abs): 0 10*3/uL (ref 0.0–0.1)
Immature Granulocytes: 0 %
Lymphocytes Absolute: 1.4 10*3/uL (ref 0.7–3.1)
Lymphs: 30 %
MCH: 30.3 pg (ref 26.6–33.0)
MCHC: 33.9 g/dL (ref 31.5–35.7)
MCV: 89 fL (ref 79–97)
Monocytes Absolute: 0.4 10*3/uL (ref 0.1–0.9)
Monocytes: 8 %
Neutrophils Absolute: 2.8 10*3/uL (ref 1.4–7.0)
Neutrophils: 61 %
Platelets: 255 10*3/uL (ref 150–450)
RBC: 4.29 x10E6/uL (ref 3.77–5.28)
RDW: 12.1 % (ref 11.7–15.4)
WBC: 4.7 10*3/uL (ref 3.4–10.8)

## 2020-09-16 LAB — CARBAMAZEPINE LEVEL, TOTAL: Carbamazepine (Tegretol), S: 12.8 ug/mL (ref 4.0–12.0)

## 2020-09-16 NOTE — Telephone Encounter (Signed)
LVM for pt to call about results. °

## 2020-09-16 NOTE — Telephone Encounter (Signed)
[employer] order sent to [facility]. They will obtain the auth and reach out to the patient to schedule.  

## 2020-09-16 NOTE — Telephone Encounter (Signed)
-----   Message from Suzzanne Cloud, NP sent at 09/16/2020  6:01 AM EST ----- CBC, CMP unremarkable, but carbamazepine level is elevated, may be related to timing of dosing, have her come back and get a trough level. I placed the order.

## 2020-09-17 NOTE — Telephone Encounter (Signed)
Called and spoke with pt about lab results per SS,NP note. She does not have a car and unsure when she will come back. Advised she can come Monday-Thursday (earliest 8am) and then can take morning dose after she gets labs drawn. She does plan to come to get this done, just unsure of when.

## 2020-09-21 NOTE — Telephone Encounter (Signed)
Novella Rob: [id number] (exp. 09/17/20 to 12/16/20)

## 2020-09-23 ENCOUNTER — Other Ambulatory Visit (INDEPENDENT_AMBULATORY_CARE_PROVIDER_SITE_OTHER): Payer: Self-pay

## 2020-09-23 DIAGNOSIS — Z0289 Encounter for other administrative examinations: Secondary | ICD-10-CM

## 2020-09-23 DIAGNOSIS — G40209 Localization-related (focal) (partial) symptomatic epilepsy and epileptic syndromes with complex partial seizures, not intractable, without status epilepticus: Secondary | ICD-10-CM

## 2020-09-24 LAB — CARBAMAZEPINE LEVEL, TOTAL: Carbamazepine (Tegretol), S: 9.6 ug/mL (ref 4.0–12.0)

## 2020-09-28 NOTE — Progress Notes (Signed)
I have reviewed and agreed above plan. 

## 2021-09-15 ENCOUNTER — Ambulatory Visit: Payer: Managed Care, Other (non HMO) | Admitting: Neurology

## 2021-09-22 ENCOUNTER — Ambulatory Visit: Payer: Managed Care, Other (non HMO) | Admitting: Neurology

## 2021-11-17 ENCOUNTER — Other Ambulatory Visit: Payer: Self-pay | Admitting: Neurology

## 2021-12-23 ENCOUNTER — Other Ambulatory Visit: Payer: Self-pay | Admitting: Neurology

## 2021-12-28 ENCOUNTER — Ambulatory Visit: Payer: Self-pay | Admitting: Neurology

## 2022-01-27 ENCOUNTER — Other Ambulatory Visit: Payer: Self-pay | Admitting: Neurology

## 2022-04-19 NOTE — Progress Notes (Unsigned)
PATIENT: Amanda Hart DOB: 1991-01-12  REASON FOR VISIT: follow up for seizures HISTORY FROM: patient PRIMARY NEUROLOGIST: Dr. Krista Blue  HISTORY Amanda Hart is a 31 year old female, seen in refer by primary care physician Dr. Dorthy Cooler, [place] for evaluation of seizure, initial evaluation was on December 19, 2017.   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   Update September 12, 2019 SS: She has not had recurrent seizure since last seen, last seizure was in 2015.  She remains on Tegretol 400 mg twice a day.  She is tolerating medication without side effect.  She has not been interested in switching to Lamictal in the past.  She denies any further dj vu episodes, since changing her diet to limiting sugar and carbs.  She is not driving a car right now.  She does not work, says she is a stay-at-home wife.  She indicates her overall health is well.  She denies any new problems or concerns. She was unable to get EEG or MRI of the brain due to high cost.    Update September 15, 2020 SS: Here today for follow-up, overall doing well, remains on Tegretol 200 mg, 2 tablets twice a day. Last seizure in 2015, no reported dj vu spells. Tolerating well. Is currently looking for a job, has associates degree in business administration. She is married, she does not want children. Has not had MRI or EEG due to cost.  Update April 20, 2022 SS: Reports seizure 04/03/22 (Sunday), had missed her medication for 2 days, the night before felt cold sweats, on Saturday, feeling tingling sensation to face, arms, legs, did yard work was hard to perform physical tasks, body felt heavy, took a break, had a headache, the next day had full body seizure, had tongue injury. She missed her medication because she was late getting to the pharmacy, doesn't  drive. She lives with friend, is now divorced. 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 Denies any chance of pregnancy.  Labs at last office visit in November 2021 CBC, CMP were unremarkable, carbamazepine level was slightly elevated at 12.8, she did not come back for a trough level. Has never had MRI brain or EEG due to expense.  REVIEW OF SYSTEMS: Out of a complete 14 system review of symptoms, the patient complains only of the following  symptoms, and all other reviewed systems are negative.  See HPI  ALLERGIES: No Known Allergies  HOME MEDICATIONS: Outpatient Medications Prior to Visit  Medication Sig Dispense Refill   carbamazepine (TEGRETOL) 200 MG tablet TAKE 2 TABLETS BY MOUTH TWICE A DAY. Must keep appt 03/20/22 for any further refills. If missed, will need to ask PCP. 120 tablet 2   No facility-administered medications prior to visit.    PAST MEDICAL HISTORY: Past Medical History:  Diagnosis Date   Benign brain tumor ([place])    Seizure ([place])     PAST SURGICAL HISTORY: Past Surgical History:  Procedure Laterality Date   BRAIN SURGERY     removal of benign tumor    FAMILY HISTORY: Family History  Problem Relation Age of Onset   Healthy Mother    Healthy Father    Healthy Brother     SOCIAL HISTORY: Social History   Socioeconomic History   Marital status: Married    Spouse name: Not on file   Number of children: 0   Years of education: 14   Highest education level: Associate degree: occupational, Hotel manager, or vocational program  Occupational History   Occupation: Retail  Tobacco Use   Smoking status: Never   Smokeless tobacco: Never  Vaping Use   Vaping Use: Never used  Substance and Sexual Activity   Alcohol use: No   Drug use: No   Sexual activity: Not on file  Other Topics Concern   Not on file  Social History Narrative   Lives at home with her husband.   Right-handed.   Occasional caffeine use.   Social Determinants of Health   Financial Resource Strain: Not on file  Food Insecurity: Not on file  Transportation Needs: Not on file  Physical Activity: Not on file  Stress: Not on file  Social Connections: Not on file  Intimate Partner Violence: Not on file   PHYSICAL EXAM  Vitals:   04/20/22 0736  BP: 118/81  Pulse: 99  Weight: 145 lb (65.8 kg)  Height: '5\' 4"'$  (1.626 m)    Body mass index is 24.89 kg/m.  Generalized: Well developed, in no acute distress    Neurological examination  Mentation: Alert oriented to time, place, history taking. Follows all commands speech and language fluent Cranial nerve II-XII: Pupils were equal round reactive to light. Extraocular movements were full, visual field were full on confrontational test. Facial sensation and strength were normal. Head turning and shoulder shrug  were normal and symmetric. Motor: The motor testing reveals 5 over 5 strength of all 4 extremities. Good symmetric motor tone is noted throughout.  Sensory: Sensory testing is intact to soft touch on all 4 extremities. No evidence of extinction is noted.  Coordination: Cerebellar testing reveals good finger-nose-finger and heel-to-shin bilaterally.  Gait and station: Gait is normal.  Reflexes: Deep tendon reflexes are symmetric and normal bilaterally.   DIAGNOSTIC DATA (LABS, IMAGING, TESTING) - I reviewed patient records, labs, notes, testing and imaging myself where available.  Lab Results  Component Value Date   WBC 4.7 09/15/2020   HGB 13.0 09/15/2020   HCT 38.3 09/15/2020   MCV 89 09/15/2020   PLT 255 09/15/2020      Component Value Date/Time   NA 139 09/15/2020 1526   K 4.2 09/15/2020 1526   CL 101 09/15/2020 1526   CO2 25 09/15/2020 1526   GLUCOSE 87 09/15/2020 1526   BUN 7 09/15/2020 1526   CREATININE 0.52 (L) 09/15/2020 1526   CALCIUM 9.0 09/15/2020 1526   PROT 7.2 09/15/2020 1526   ALBUMIN 4.6 09/15/2020 1526   AST 12 09/15/2020 1526   ALT 8 09/15/2020 1526   ALKPHOS 89 09/15/2020 1526   BILITOT 0.2 09/15/2020 1526   GFRNONAA 129 09/15/2020 1526   GFRAA 148 09/15/2020 1526   No results found for: "CHOL", "HDL", "LDLCALC", "LDLDIRECT", "TRIG", "CHOLHDL" No results found for: "HGBA1C" No results found for: "VITAMINB12" Lab Results  Component Value Date   TSH 1.090 12/19/2017   ASSESSMENT AND PLAN 31 y.o. year old female  has a past medical history of Benign brain tumor ([place]) and Seizure ([place]). here with   1.   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  Complex partial seizure  -Recent seizure event 04/03/22 in the setting of 2 days of missed carbamazepine -Check routine labs today -Order MRI of the brain with and without contrast, never completed as part of work up, most recent seizure was associated with new paresthesia. History of brain tumor, resection in 2008 -Continue Tegretol 400 mg twice a day, encourage compliance, not to miss any doses, discussed checking into pharmacy delivery, she does not drive, have discussed [place] law, no driving until seizure-free 6 months -In the past have discussed Lamictal, will revisit again  -Call for any seizure activity, otherwise follow-up 1 year or sooner if needed  Orders Placed This Encounter  Procedures   MR BRAIN W WO CONTRAST   CBC with Differential/Platelet   CMP   Carbamazepine level, total   Butler Denmark, [place], DNP 04/20/2022, 8:05 AM [facility] [address] [place], [place] [address] [phone]

## 2022-04-20 ENCOUNTER — Ambulatory Visit: Payer: Commercial Managed Care - HMO | Admitting: Neurology

## 2022-04-20 ENCOUNTER — Encounter: Payer: Self-pay | Admitting: Neurology

## 2022-04-20 ENCOUNTER — Telehealth: Payer: Self-pay | Admitting: Neurology

## 2022-04-20 VITALS — BP 118/81 | HR 99 | Ht 64.0 in | Wt 145.0 lb

## 2022-04-20 DIAGNOSIS — D330 Benign neoplasm of brain, supratentorial: Secondary | ICD-10-CM

## 2022-04-20 DIAGNOSIS — G40209 Localization-related (focal) (partial) symptomatic epilepsy and epileptic syndromes with complex partial seizures, not intractable, without status epilepticus: Secondary | ICD-10-CM

## 2022-04-20 MED ORDER — CARBAMAZEPINE 200 MG PO TABS: ORAL_TABLET | ORAL | 4 refills | Status: DC

## 2022-04-20 NOTE — Telephone Encounter (Signed)
[employer] sent to [facility] they obtain auth  

## 2022-04-20 NOTE — Patient Instructions (Signed)
No driving until seizure free 6 months Continue current medications, take daily not to miss any doses Check labs, MRI Call for any seizures

## 2022-04-21 LAB — CBC WITH DIFFERENTIAL/PLATELET
Basophils Absolute: 0 10*3/uL (ref 0.0–0.2)
Basos: 1 %
EOS (ABSOLUTE): 0 10*3/uL (ref 0.0–0.4)
Eos: 1 %
Hematocrit: 38.2 % (ref 34.0–46.6)
Hemoglobin: 12.7 g/dL (ref 11.1–15.9)
Immature Grans (Abs): 0 10*3/uL (ref 0.0–0.1)
Immature Granulocytes: 0 %
Lymphocytes Absolute: 1.2 10*3/uL (ref 0.7–3.1)
Lymphs: 26 %
MCH: 30 pg (ref 26.6–33.0)
MCHC: 33.2 g/dL (ref 31.5–35.7)
MCV: 90 fL (ref 79–97)
Monocytes Absolute: 0.4 10*3/uL (ref 0.1–0.9)
Monocytes: 9 %
Neutrophils Absolute: 2.8 10*3/uL (ref 1.4–7.0)
Neutrophils: 63 %
Platelets: 231 10*3/uL (ref 150–450)
RBC: 4.24 x10E6/uL (ref 3.77–5.28)
RDW: 12.8 % (ref 11.7–15.4)
WBC: 4.4 10*3/uL (ref 3.4–10.8)

## 2022-04-21 LAB — COMPREHENSIVE METABOLIC PANEL
ALT: 22 IU/L (ref 0–32)
AST: 24 IU/L (ref 0–40)
Albumin/Globulin Ratio: 1.9 (ref 1.2–2.2)
Albumin: 4.8 g/dL (ref 3.8–4.8)
Alkaline Phosphatase: 106 IU/L (ref 44–121)
BUN/Creatinine Ratio: 12 (ref 9–23)
BUN: 8 mg/dL (ref 6–20)
Bilirubin Total: 0.2 mg/dL (ref 0.0–1.2)
CO2: 22 mmol/L (ref 20–29)
Calcium: 9.1 mg/dL (ref 8.7–10.2)
Chloride: 99 mmol/L (ref 96–106)
Creatinine, Ser: 0.67 mg/dL (ref 0.57–1.00)
Globulin, Total: 2.5 g/dL (ref 1.5–4.5)
Glucose: 91 mg/dL (ref 70–99)
Potassium: 4.2 mmol/L (ref 3.5–5.2)
Sodium: 138 mmol/L (ref 134–144)
Total Protein: 7.3 g/dL (ref 6.0–8.5)
eGFR: 119 mL/min/{1.73_m2} (ref >59)

## 2022-04-21 LAB — CARBAMAZEPINE LEVEL, TOTAL: Carbamazepine (Tegretol), S: 10.5 ug/mL (ref 4.0–12.0)

## 2022-05-01 ENCOUNTER — Other Ambulatory Visit: Payer: Self-pay

## 2022-05-22 ENCOUNTER — Ambulatory Visit
Admission: RE | Admit: 2022-05-22 | Discharge: 2022-05-22 | Disposition: A | Discharge status: Home / Self Care | Payer: Commercial Managed Care - HMO | Source: Ambulatory Visit | Attending: Neurology | Admitting: Neurology

## 2022-05-22 DIAGNOSIS — G40209 Localization-related (focal) (partial) symptomatic epilepsy and epileptic syndromes with complex partial seizures, not intractable, without status epilepticus: Secondary | ICD-10-CM

## 2022-05-22 MED ORDER — GADOBENATE DIMEGLUMINE 529 MG/ML IV SOLN
13.0000 mL | Freq: Once | INTRAVENOUS | Status: AC | PRN
  Administered 2022-05-22: 13 mL via INTRAVENOUS

## 2023-04-25 NOTE — Progress Notes (Unsigned)
PATIENT: Amanda Hart DOB: Aug 07, 1991  REASON FOR VISIT: follow up for seizures HISTORY FROM: patient PRIMARY NEUROLOGIST: Dr. Terrace Arabia  HISTORY Amanda Hart is a 32 year old female, seen in refer by primary care physician Dr. Docia Chuck, [place] for evaluation of seizure, initial evaluation was on December 19, 2017.   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   Laboratory evaluations normal CMP, creatinine of 0.63, CBC hemoglobin of 12.9 Tegretol level was 1.9   UPDATE July 03 2018: She now works as a IT sales professional since April 2019, driving now, there was no sudden loss of consciousness episode, she still has occasionally dj vu episode, felt that she has done this being here, lasting for few seconds, no total loss of consciousness,   We talked extensively during her last visit to change her from Tegretol to lamotrigine, she wants to hold  off medication change at this point, she did not have her MRI or EEG scheduled either.   Update September 12, 2019 SS: She has not had recurrent seizure since last seen, last seizure was in 2015.  She remains on Tegretol 400 mg twice a day.  She is tolerating medication without side effect.  She has not been interested in switching to Lamictal in the past.  She denies any further dj vu episodes, since changing her diet to limiting sugar and carbs.  She is not driving a car right now.  She does not work, says she is a stay-at-home wife.  She indicates her overall health is well.  She denies any new problems or concerns. She was unable to get EEG or MRI of the brain due to high cost.    Update September 15, 2020 SS: Here today for follow-up, overall doing well, remains on Tegretol 200 mg, 2 tablets twice a day. Last seizure in 2015, no reported dj vu spells. Tolerating well. Is currently looking for a job, has associates degree in business administration. She is married, she does not want children. Has not had MRI or EEG due to cost.  Update April 20, 2022 SS: Reports seizure 04/03/22 (Sunday), had missed her medication for 2 days, the night before felt cold sweats, on Saturday, feeling tingling sensation to face, arms, legs, did yard work was hard to perform physical tasks, body felt heavy, took a break, had a headache, the next day had full body seizure, had tongue injury. She missed her medication because she was late getting to the pharmacy, doesn't drive.  She lives with friend, is now divorced. 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 Denies any chance of pregnancy.  Labs at last office visit in November 2021 CBC, CMP were unremarkable, carbamazepine level was slightly elevated at 12.8, she did not come back for a trough level. Has never had MRI brain or EEG due to expense.  Update April 26, 2023 SS: Had MRI of the brain July 2023 showing no acute problem.  Showed evidence of left  posterior frontoparietal craniotomy with postoperative changes.  No abnormal areas of enhancement with contrast.  CBC and CMP were normal.  Carbamazepine level 10.5. No seizures, remains on carbamazepine 200 mg 2 tablets twice daily. 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 No sinus issues. She doesn't have any health insurance right now.   REVIEW OF SYSTEMS: Out of a complete 14 system review of symptoms, the patient complains only of the following symptoms, and all other reviewed systems are negative.  See HPI  ALLERGIES: No Known Allergies  HOME MEDICATIONS: Outpatient Medications Prior to Visit  Medication Sig Dispense Refill   carbamazepine (TEGRETOL) 200 MG tablet TAKE 2 TABLETS BY MOUTH TWICE A DAY. 360 tablet 4   No facility-administered medications prior to visit.    PAST MEDICAL HISTORY: Past Medical History:  Diagnosis Date   Benign brain tumor (HCC)    Seizure (HCC)     PAST SURGICAL HISTORY: Past Surgical History:  Procedure Laterality Date   BRAIN SURGERY     removal of benign tumor    FAMILY HISTORY: Family History  Problem Relation Age of Onset   Healthy Mother    Healthy Father    Healthy Brother     SOCIAL HISTORY: Social History   Socioeconomic History   Marital status: Divorced    Spouse name: Not on file   Number of children: 0   Years of education: 14   Highest education level: Associate degree: occupational, Scientist, product/process development, or vocational program  Occupational History   Occupation: Retail  Tobacco Use   Smoking status: Never   Smokeless tobacco: Never  Vaping Use   Vaping Use: Never used  Substance and Sexual Activity   Alcohol use: No   Drug use: No   Sexual activity: Not Currently  Other Topics Concern   Not on file  Social History Narrative   Lives at home with her husband.   Right-handed.   Occasional caffeine use.   Social Determinants of Health   Financial Resource Strain: Not on file  Food Insecurity: Not on file   Transportation Needs: Not on file  Physical Activity: Not on file  Stress: Not on file  Social Connections: Not on file  Intimate Partner Violence: Not on file   PHYSICAL EXAM  Vitals:   04/26/23 0741  BP: 116/75  Pulse: 72  Weight: 135 lb 8 oz (61.5 kg)  Height: 5\' 4"  (1.626 m)   Body mass index is 23.26 kg/m.  Generalized: Well developed, in no acute distress   Neurological examination  Mentation: Alert oriented to time, place, history taking. Follows all commands speech and language fluent Cranial nerve II-XII: Pupils were equal round reactive to light. Extraocular movements were full, visual field were full on confrontational test. Facial sensation and strength were normal. Head turning and shoulder shrug  were normal and symmetric. Motor: The motor testing reveals 5 over 5 strength of all 4 extremities. Good symmetric motor tone is noted throughout.  Sensory: Sensory testing is intact to soft touch on all  4 extremities. No evidence of extinction is noted.  Coordination: Cerebellar testing reveals good finger-nose-finger and heel-to-shin bilaterally.  Gait and station: Gait is normal.  Tandem gait is normal. Reflexes: Deep tendon reflexes are symmetric and normal bilaterally.   DIAGNOSTIC DATA (LABS, IMAGING, TESTING) - I reviewed patient records, labs, notes, testing and imaging myself where available.  Lab Results  Component Value Date   WBC 4.4 04/20/2022   HGB 12.7 04/20/2022   HCT 38.2 04/20/2022   MCV 90 04/20/2022   PLT 231 04/20/2022      Component Value Date/Time   NA 138 04/20/2022 0804   K 4.2 04/20/2022 0804   CL 99 04/20/2022 0804   CO2 22 04/20/2022 0804   GLUCOSE 91 04/20/2022 0804   BUN 8 04/20/2022 0804   CREATININE 0.67 04/20/2022 0804   CALCIUM 9.1 04/20/2022 0804   PROT 7.3 04/20/2022 0804   ALBUMIN 4.8 04/20/2022 0804   AST 24 04/20/2022 0804   ALT 22 04/20/2022 0804   ALKPHOS 106 04/20/2022 0804   BILITOT 0.2 04/20/2022 0804    GFRNONAA 129 09/15/2020 1526   GFRAA 148 09/15/2020 1526   No results found for: "CHOL", "HDL", "LDLCALC", "LDLDIRECT", "TRIG", "CHOLHDL" No results found for: "HGBA1C" No results found for: "VITAMINB12" Lab Results  Component Value Date   TSH 1.090 12/19/2017   ASSESSMENT AND PLAN 32 y.o. year old female  has a past medical history of Benign brain tumor (HCC) and Seizure (HCC). here with   1.  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  Complex partial seizure  -Doing overall well, last seizure 04/03/22 in the setting of 2 days of missed carbamazepine -MRI of the brain with and without contrast in July 2023 showed changes of left posterior frontoparietal craniotomy with area of cystic encephalomalacia and gliosis, no enhancement or acute abnormalities -She does not want labs today, does not have insurance, labs at last visit were fine -We have discussed that pregnancy is not recommended on carbamazepine, we have discussed switching to Lamictal and she is not interested, discussed below from up to date, will start Folic Acid 1 mg daily, have recommended contraception -Continue carbamazepine 200 mg, 2 tablets twice daily -Follow-up in 1 year or sooner if needed, call for seizure activity  Meds ordered this encounter  Medications   folic acid (FOLVITE) 1 MG tablet    Sig: Take 1 tablet (1 mg total) by mouth daily.    Dispense:  30 tablet    Refill:  11   carbamazepine (TEGRETOL) 200 MG tablet    Sig: TAKE 2 TABLETS BY MOUTH TWICE A DAY.    Dispense:  360 tablet    Refill:  4   From Up to Date: Carbamazepine may be associated with teratogenic effects, including spina bifida, craniofacial defects, and cardiovascular malformations. Data from the International Registry of Antiepileptic Drugs and Pregnancy (EURAP) and the [place] and [place] Epilepsy Pregnancy Registers [facility]) note the risk of congenital malformations increases  with higher doses Orvan Falconer 2014; [place] 2011). The risk of teratogenic effects is higher with antiseizure polytherapy than monotherapy. Developmental delays have also been observed following in utero exposure to carbamazepine (per manufacturer). Folic acid supplementation is recommended if treatment is required during pregnancy.   FINDINGS:  The brain parenchyma shows area of cystic encephalomalacia with gliosis in the high left frontoparietal parasagittal region with postoperative changes of craniotomy adjacent.  Postcontrast images do not show any abnormal enhancement in this area.  There is no adjacent meningeal thickening as well.  No other structural lesion, tumor or infarct is noted.  Diffusion-weighted imaging shows no acute ischemia.  SWI images do not show any significant hemorrhages.  Cortical sulci and gyri show normal appearance.  Extra-axial brain structures appear normal.  Calvarium shows postoperative changes in the posterior frontoparietal region.  Postcontrast images do not result in abnormal areas of enhancement.  Orbits appear unremarkable.  Paranasal sinuses show no significant abnormalities.  Attribute name and cerebellar tonsils appear normal.  Visualization of the cervical spine shows no abnormalities.  Flow-voids of large vessel) circulation appear to be patent   MRI of the brain with and without contrast 05/22/22 IMPRESSION: MRI scan of the brain with and without contrast showing changes of left posterior frontoparietal craniotomy with area of cystic encephalomalacia and gliosis in the underlying parasagittal left posterior frontoparietal cortex.  There is no abnormal enhancement.  No acute abnormalities are noted.   Margie Ege, AGNP-C, DNP 04/26/2023, 9:08 AM [facility] [address] [place], [place] [address] [phone]

## 2023-04-26 ENCOUNTER — Ambulatory Visit (INDEPENDENT_AMBULATORY_CARE_PROVIDER_SITE_OTHER): Payer: Self-pay | Admitting: Neurology

## 2023-04-26 ENCOUNTER — Encounter: Payer: Self-pay | Admitting: Neurology

## 2023-04-26 VITALS — BP 116/75 | HR 72 | Ht 64.0 in | Wt 135.5 lb

## 2023-04-26 DIAGNOSIS — G40209 Localization-related (focal) (partial) symptomatic epilepsy and epileptic syndromes with complex partial seizures, not intractable, without status epilepticus: Secondary | ICD-10-CM

## 2023-04-26 MED ORDER — FOLIC ACID 1 MG PO TABS: 1.0000 mg | ORAL_TABLET | Freq: Every day | ORAL | 11 refills | Status: DC

## 2023-04-26 MED ORDER — CARBAMAZEPINE 200 MG PO TABS: ORAL_TABLET | ORAL | 4 refills | Status: DC

## 2023-04-26 NOTE — Patient Instructions (Signed)
Start folic acid 1 mg daily, continue carbamazepine for seizure prevention. We do not recommend pregnancy with carbamazepine and have discussed switching to Lamictal. Would recommend labs. Call for seizures

## 2024-01-10 ENCOUNTER — Telehealth: Payer: Self-pay | Admitting: Neurology

## 2024-01-10 ENCOUNTER — Encounter: Payer: Self-pay | Admitting: Neurology

## 2024-01-10 NOTE — Telephone Encounter (Signed)
 LVM and sent letter in mail informing pt of need to reschedule 04/25/24 appt - NP out

## 2024-04-25 ENCOUNTER — Ambulatory Visit: Payer: Self-pay | Admitting: Neurology

## 2024-05-09 ENCOUNTER — Telehealth: Payer: Self-pay | Admitting: Neurology

## 2024-05-14 MED ORDER — CARBAMAZEPINE 200 MG PO TABS: ORAL_TABLET | ORAL | 4 refills | Status: AC

## 2024-05-14 NOTE — Telephone Encounter (Signed)
 refilled

## 2024-05-14 NOTE — Telephone Encounter (Signed)
 Pt called to request Medication Refill  carbamazepine  (TEGRETOL ) 200 MG tablet   Pt would like Medication to be sent to :  [id number] [place], [place] - [address] Phone: [phone]  Fax: [phone]

## 2024-07-04 ENCOUNTER — Encounter: Payer: Self-pay | Admitting: Neurology

## 2024-07-04 ENCOUNTER — Ambulatory Visit (INDEPENDENT_AMBULATORY_CARE_PROVIDER_SITE_OTHER): Payer: Self-pay | Admitting: Neurology

## 2024-07-04 VITALS — BP 122/70 | Ht 63.0 in | Wt 147.0 lb

## 2024-07-04 DIAGNOSIS — G40209 Localization-related (focal) (partial) symptomatic epilepsy and epileptic syndromes with complex partial seizures, not intractable, without status epilepticus: Secondary | ICD-10-CM

## 2024-07-04 MED ORDER — FOLIC ACID 1 MG PO TABS: 1.0000 mg | ORAL_TABLET | Freq: Every day | ORAL | 11 refills | Status: AC

## 2024-07-04 NOTE — Progress Notes (Signed)
 PATIENT: Amanda Hart DOB: 12-Jun-1991  REASON FOR VISIT: follow up for seizures HISTORY FROM: patient PRIMARY NEUROLOGIST: Dr. Onita  HISTORY Amanda Hart is a 33 year old female, seen in refer by primary care physician Dr. Regino, [place] for evaluation of seizure, initial evaluation was on December 19, 2017.   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   Update September 12, 2019 SS: She has not had recurrent seizure since last seen, last seizure was in 2015.  She remains on Tegretol  400 mg twice a day.  She is tolerating medication without side effect.  She has not been interested in switching to Lamictal  in the past.  She denies any further dj vu episodes, since changing her diet to limiting sugar and carbs.  She is not driving a car right now.  She does not work, says she is a stay-at-home wife.  She indicates her overall health is well.  She denies any new problems or concerns. She was unable to get EEG or MRI of the brain due to high cost.    Update September 15, 2020 SS: Here today for follow-up, overall doing well, remains on Tegretol  200 mg, 2 tablets twice a day. Last seizure in 2015, no reported dj vu spells. Tolerating well. Is currently looking for a job, has associates degree in business administration. She is married, she does not want children. Has not had MRI or EEG due to cost.  Update April 20, 2022 SS: Reports seizure 04/03/22 (Sunday), had missed her medication for 2 days, the night before felt cold sweats, on Saturday, feeling tingling sensation to face, arms, legs, did yard work was hard to perform physical tasks, body felt heavy, took a break, had a headache, the next day had full body seizure, had tongue injury. She missed her medication because she was late getting to the pharmacy, doesn't drive.  She lives with friend, is now divorced. 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 Denies any chance of pregnancy.  Labs at last office visit in November 2021 CBC, CMP were unremarkable, carbamazepine  level was slightly elevated at 12.8, she did not come back for a trough level. Has never had MRI brain or EEG due to expense.  Update April 26, 2023 SS: Had MRI of the brain July 2023 showing no acute problem.  Showed evidence of left  posterior frontoparietal craniotomy with postoperative changes.  No abnormal areas of enhancement with contrast.  CBC and CMP were normal.  Carbamazepine  level 10.5. No seizures, remains on carbamazepine  200 mg 2 tablets twice daily. 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 No sinus issues. She doesn't have any health insurance right now.   Update July 04, 2024 SS: no seizures, remains on carbamazepine . No issues or concerns.  Is working full-time.  Does not have health insurance.  Denies headache.  Is not sexually active and not planning pregnancy.  REVIEW OF SYSTEMS: Out of a complete 14 system review of symptoms, the patient complains only of the following symptoms, and all other reviewed systems are negative.  See HPI  ALLERGIES: No Known Allergies  HOME MEDICATIONS: Outpatient Medications Prior to Visit  Medication Sig Dispense Refill   carbamazepine  (TEGRETOL ) 200 MG tablet TAKE 2 TABLETS BY MOUTH TWICE A DAY. 360 tablet 4   folic acid  (FOLVITE ) 1 MG tablet Take 1 tablet (1 mg total) by mouth daily. 30 tablet 11   No facility-administered medications prior to visit.    PAST MEDICAL HISTORY: Past Medical History:  Diagnosis Date   Benign brain tumor (HCC)    Seizure (HCC)     PAST SURGICAL HISTORY: Past Surgical History:  Procedure Laterality Date   BRAIN SURGERY     removal of benign tumor    FAMILY HISTORY: Family History  Problem Relation Age of Onset   Healthy Mother    Healthy Father    Healthy Brother     SOCIAL HISTORY: Social History   Socioeconomic History   Marital status: Divorced    Spouse name: Not on file   Number of children: 0   Years of education: 14   Highest education level: Associate degree: occupational, Scientist, product/process development, or vocational program  Occupational History   Occupation: Retail  Tobacco Use   Smoking status: Never   Smokeless tobacco: Never  Vaping Use   Vaping status: Never Used  Substance and Sexual Activity   Alcohol use: No    Drug use: No   Sexual activity: Not Currently  Other Topics Concern   Not on file  Social History Narrative   Lives at home with her husband.   Right-handed.   Occasional caffeine use.   Social Drivers of Corporate investment banker Strain: Not on file  Food Insecurity: Not on file  Transportation Needs: Not on file  Physical Activity: Not on file  Stress: Not on file  Social Connections: Not on file  Intimate Partner Violence: Not on file   PHYSICAL EXAM  Vitals:   07/04/24 0747  BP: 122/70  Weight: 147 lb (66.7 kg)  Height: 5' 3 (1.6 m)   Body mass index is 26.04 kg/m.  Generalized: Well developed, in no acute distress   Neurological examination  Mentation: Alert oriented to time, place, history taking. Follows all commands speech and language fluent Cranial nerve II-XII: Pupils were equal round reactive to light. Extraocular movements were full, visual field  were full on confrontational test. Facial sensation and strength were normal. Head turning and shoulder shrug  were normal and symmetric. Motor: The motor testing reveals 5 over 5 strength of all 4 extremities. Good symmetric motor tone is noted throughout.  Sensory: Sensory testing is intact to soft touch on all 4 extremities. No evidence of extinction is noted.  Coordination: Cerebellar testing reveals good finger-nose-finger and heel-to-shin bilaterally.  Gait and station: Gait is normal.   Reflexes: Deep tendon reflexes are symmetric and normal bilaterally.   DIAGNOSTIC DATA (LABS, IMAGING, TESTING) - I reviewed patient records, labs, notes, testing and imaging myself where available.  Lab Results  Component Value Date   WBC 4.4 04/20/2022   HGB 12.7 04/20/2022   HCT 38.2 04/20/2022   MCV 90 04/20/2022   PLT 231 04/20/2022      Component Value Date/Time   NA 138 04/20/2022 0804   K 4.2 04/20/2022 0804   CL 99 04/20/2022 0804   CO2 22 04/20/2022 0804   GLUCOSE 91 04/20/2022 0804   BUN 8  04/20/2022 0804   CREATININE 0.67 04/20/2022 0804   CALCIUM 9.1 04/20/2022 0804   PROT 7.3 04/20/2022 0804   ALBUMIN 4.8 04/20/2022 0804   AST 24 04/20/2022 0804   ALT 22 04/20/2022 0804   ALKPHOS 106 04/20/2022 0804   BILITOT 0.2 04/20/2022 0804   GFRNONAA 129 09/15/2020 1526   GFRAA 148 09/15/2020 1526   No results found for: CHOL, HDL, LDLCALC, LDLDIRECT, TRIG, CHOLHDL No results found for: YHAJ8R No results found for: VITAMINB12 Lab Results  Component Value Date   TSH 1.090 12/19/2017   ASSESSMENT AND PLAN 33 y.o. year old female  has a past medical history of Benign brain tumor (HCC) and Seizure (HCC). here with   1.  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  Complex partial seizure  - Doing very well, no seizures - Continue carbamazepine  200 mg, 2 tablets twice a day - Recommend folic acid  1 mg daily female childbearing age on ASM - Check routine labs today  -Last seizure 04/03/22 in the setting of 2 days of missed carbamazepine  -MRI of the brain with and without contrast in July 2023 showed changes of left posterior frontoparietal craniotomy with area of cystic encephalomalacia and gliosis, no enhancement or acute abnormalities -We have previously discussed switching to Lamictal , she has not been interested, is aware that pregnancy is not recommended on carbamazepine   Call for seizure activity, follow-up in 1 year virtually or sooner if needed  From Up to Date: Carbamazepine  may be associated with teratogenic effects, including spina bifida, craniofacial defects, and cardiovascular malformations. Data from the International Registry of Antiepileptic Drugs and Pregnancy (EURAP) and the [place] and [place] Epilepsy Pregnancy Registers [facility]) note the risk of congenital malformations increases with higher doses [place] 2014; Tom[place] 2011). The risk of teratogenic effects is higher with antiseizure  polytherapy than monotherapy. Developmental delays have also been observed following in utero exposure to carbamazepine  (per manufacturer). Folic acid  supplementation is recommended if treatment is required during pregnancy.   FINDINGS:  The brain parenchyma shows area of cystic encephalomalacia with gliosis in the high left frontoparietal parasagittal region with postoperative changes of craniotomy adjacent.  Postcontrast images do not show any abnormal enhancement in this area.  There is no adjacent meningeal thickening as well.  No other structural lesion, tumor or infarct is noted.  Diffusion-weighted imaging shows no acute ischemia.  SWI images do not show  any significant hemorrhages.  Cortical sulci and gyri show normal appearance.  Extra-axial brain structures appear normal.  Calvarium shows postoperative changes in the posterior frontoparietal region.  Postcontrast images do not result in abnormal areas of enhancement.  Orbits appear unremarkable.  Paranasal sinuses show no significant abnormalities.  Attribute name and cerebellar tonsils appear normal.  Visualization of the cervical spine shows no abnormalities.  Flow-voids of large vessel) circulation appear to be patent   MRI of the brain with and without contrast 05/22/22 IMPRESSION: MRI scan of the brain with and without contrast showing changes of left posterior frontoparietal craniotomy with area of cystic encephalomalacia and gliosis in the underlying parasagittal left posterior frontoparietal cortex.  There is no abnormal enhancement.  No acute abnormalities are noted.   Lauraine Born, AGNP-C, DNP 07/04/2024, 7:49 AM [facility] [address] [place], [place] [address] [phone]

## 2024-07-04 NOTE — Patient Instructions (Signed)
 Great to see you today! Continue current medication Recommend folic acid  1 mg daily Recommend against pregnancy while taking carbamazepine  Check routine labs today Call for seizure activity Follow-up in 1 year.  Thanks!!

## 2024-07-05 LAB — COMPREHENSIVE METABOLIC PANEL WITH GFR
ALT: 13 IU/L (ref 0–32)
AST: 20 IU/L (ref 0–40)
Albumin: 4.7 g/dL (ref 3.9–4.9)
Alkaline Phosphatase: 95 IU/L (ref 44–121)
BUN/Creatinine Ratio: 13 (ref 9–23)
BUN: 8 mg/dL (ref 6–20)
Bilirubin Total: 0.4 mg/dL (ref 0.0–1.2)
CO2: 20 mmol/L (ref 20–29)
Calcium: 9.1 mg/dL (ref 8.7–10.2)
Chloride: 101 mmol/L (ref 96–106)
Creatinine, Ser: 0.63 mg/dL (ref 0.57–1.00)
Globulin, Total: 2.3 g/dL (ref 1.5–4.5)
Glucose: 95 mg/dL (ref 70–99)
Potassium: 4.2 mmol/L (ref 3.5–5.2)
Sodium: 138 mmol/L (ref 134–144)
Total Protein: 7 g/dL (ref 6.0–8.5)
eGFR: 119 mL/min/1.73 (ref >59)

## 2024-07-05 LAB — CBC WITH DIFFERENTIAL/PLATELET
Basophils Absolute: 0 x10E3/uL (ref 0.0–0.2)
Basos: 1 %
EOS (ABSOLUTE): 0 x10E3/uL (ref 0.0–0.4)
Eos: 1 %
Hematocrit: 40.2 % (ref 34.0–46.6)
Hemoglobin: 13.2 g/dL (ref 11.1–15.9)
Immature Grans (Abs): 0 x10E3/uL (ref 0.0–0.1)
Immature Granulocytes: 0 %
Lymphocytes Absolute: 1.2 x10E3/uL (ref 0.7–3.1)
Lymphs: 29 %
MCH: 30.3 pg (ref 26.6–33.0)
MCHC: 32.8 g/dL (ref 31.5–35.7)
MCV: 92 fL (ref 79–97)
Monocytes Absolute: 0.4 x10E3/uL (ref 0.1–0.9)
Monocytes: 8 %
Neutrophils Absolute: 2.6 x10E3/uL (ref 1.4–7.0)
Neutrophils: 61 %
Platelets: 183 x10E3/uL (ref 150–450)
RBC: 4.35 x10E6/uL (ref 3.77–5.28)
RDW: 12 % (ref 11.7–15.4)
WBC: 4.2 x10E3/uL (ref 3.4–10.8)

## 2024-07-05 LAB — CARBAMAZEPINE LEVEL, TOTAL: Carbamazepine (Tegretol), S: 8.9 ug/mL (ref 4.0–12.0)

## 2024-07-08 ENCOUNTER — Ambulatory Visit: Payer: Self-pay | Admitting: Neurology
